# Patient Record
Sex: Male | Born: 2000 | Race: White | Hispanic: No | Marital: Single | Smoking: Never smoker
Health system: Southern US, Community
[De-identification: ages and names within clinical notes are randomized; demographics above are authoritative.]

---
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
## 2019-02-02 DIAGNOSIS — Z03818 Encounter for observation for suspected exposure to other biological agents ruled out: Secondary | ICD-10-CM | POA: Diagnosis not present

## 2019-02-02 DIAGNOSIS — Z20828 Contact with and (suspected) exposure to other viral communicable diseases: Secondary | ICD-10-CM | POA: Diagnosis not present

## 2019-05-28 DIAGNOSIS — H6983 Other specified disorders of Eustachian tube, bilateral: Secondary | ICD-10-CM | POA: Diagnosis not present
